# Patient Record
Sex: Female | Born: 1985 | Race: Asian | Hispanic: No | Marital: Married | Smoking: Never smoker
Health system: Southern US, Community
[De-identification: ages and names within clinical notes are randomized; demographics above are authoritative.]

## PROBLEM LIST (undated history)

## (undated) DIAGNOSIS — N200 Calculus of kidney: Secondary | ICD-10-CM

---

## 2019-09-22 ENCOUNTER — Encounter: Payer: Self-pay | Admitting: Adult Health

## 2019-10-05 ENCOUNTER — Telehealth: Payer: Self-pay | Admitting: Adult Health

## 2019-10-05 NOTE — Telephone Encounter (Signed)

## 2019-10-06 ENCOUNTER — Other Ambulatory Visit (HOSPITAL_COMMUNITY)
Admission: RE | Admit: 2019-10-06 | Discharge: 2019-10-06 | Disposition: A | Discharge status: Home / Self Care | Payer: 59 | Source: Ambulatory Visit | Attending: Adult Health | Admitting: Adult Health

## 2019-10-06 ENCOUNTER — Other Ambulatory Visit: Payer: Self-pay

## 2019-10-06 ENCOUNTER — Ambulatory Visit (INDEPENDENT_AMBULATORY_CARE_PROVIDER_SITE_OTHER): Payer: 59 | Admitting: Adult Health

## 2019-10-06 ENCOUNTER — Encounter: Payer: Self-pay | Admitting: Adult Health

## 2019-10-06 VITALS — BP 123/75 | HR 112 | Ht 63.25 in | Wt 158.5 lb

## 2019-10-06 DIAGNOSIS — Z01419 Encounter for gynecological examination (general) (routine) without abnormal findings: Secondary | ICD-10-CM

## 2019-10-06 DIAGNOSIS — N941 Unspecified dyspareunia: Secondary | ICD-10-CM | POA: Diagnosis not present

## 2019-10-06 NOTE — Progress Notes (Signed)
Patient ID: Aimee Howard, female   DOB: 1985/09/27, 34 y.o.   MRN: [id number] History of Present Illness: Aimee Howard is a 34 year old [place] African female, married, G0P0 in for a pelvic and pap.Her husband is with her to help with translation.  PCP is Dr Felecia Shelling.    Current Medications, Allergies, Past Medical History, Past Surgical History, Family History and Social History were reviewed in [employer] record.     Review of Systems:  Patient denies any headaches, hearing loss, fatigue, blurred vision, shortness of breath, chest pain, abdominal pain, problems with bowel movements, urination. No joint pain or mood swings. Has pain with sex.  Physical Exam:BP 123/75 (BP Location: Left Arm, Patient Position: Sitting, Cuff Size: Normal)   Pulse (!) 112   Ht 5' 3.25" (1.607 m)   Wt 158 lb 8 oz (71.9 kg)   LMP 09/15/2019   BMI 27.86 kg/m  General:  Well developed, well nourished, no acute distress Skin:  Warm and dry Neck:  Midline trachea, normal thyroid, good ROM, no lymphadenopathy Lungs; Clear to auscultation bilaterally Cardiovascular: Regular rate and rhythm  Pelvic:  External genitalia is normal in appearance, no lesions.  The vagina is normal in appearance. Urethra has no lesions or masses. The cervix is nulliparous, pap with GC/CHL and high risk HPV 16/18 genotyping performed.Marland Kitchen  Uterus is felt to be normal size, shape, and contour.  No adnexal masses or tenderness noted.Bladder is non tender, no masses felt Extremities/musculoskeletal:  No swelling or varicosities noted, no clubbing or cyanosis Psych:  No mood changes, alert and cooperative,seems happy Fall risk is low PHQ 2 score is 0. Examination chaperoned by Richelle Ito NP student.   Impression and Plan: 1. Encounter for gynecological examination with Papanicolaou smear of cervix Pap sent Physical and labs with PCP Mammogram at 41   2. Dyspareunia in female Increase foreplay Use good lubricate like  astro glide Try different positions  Try to relax and to touch self in vaginal area

## 2019-10-08 LAB — CYTOLOGY - PAP
Adequacy: ABSENT
Chlamydia: NEGATIVE
Comment: NEGATIVE
Comment: NEGATIVE
Comment: NORMAL
Diagnosis: NEGATIVE
High risk HPV: NEGATIVE
Neisseria Gonorrhea: NEGATIVE

## 2019-11-26 ENCOUNTER — Ambulatory Visit: Payer: 59 | Attending: Internal Medicine

## 2019-11-26 DIAGNOSIS — Z23 Encounter for immunization: Secondary | ICD-10-CM

## 2019-11-26 NOTE — Progress Notes (Signed)
   Covid-19 Vaccination Clinic  Name:  Aimee Howard    MRN: [id number] DOB: 1985/11/08  11/26/2019  Aimee Howard was observed post Covid-19 immunization for 15 minutes without incident. She was provided with Vaccine Information Sheet and instruction to access the V-Safe system.   Aimee Howard was instructed to call 911 with any severe reactions post vaccine: Marland Kitchen Difficulty breathing  . Swelling of face and throat  . A fast heartbeat  . A bad rash all over body  . Dizziness and weakness   Immunizations Administered    Name Date Dose VIS Date Route   Pfizer COVID-19 Vaccine 11/26/2019  9:30 AM 0.3 mL 07/24/2019 Intramuscular   Manufacturer: [employer], [employer]   Lot: [id number]   NDC: [address]-9

## 2019-12-22 ENCOUNTER — Ambulatory Visit: Payer: 59 | Attending: Internal Medicine

## 2019-12-22 DIAGNOSIS — Z23 Encounter for immunization: Secondary | ICD-10-CM

## 2019-12-22 NOTE — Progress Notes (Signed)
   Covid-19 Vaccination Clinic  Name:  Aimee Howard    MRN: [id number] DOB: Dec 13, 1985  12/22/2019  Ms. Aimee Howard was observed post Covid-19 immunization for 15 minutes without incident. She was provided with Vaccine Information Sheet and instruction to access the V-Safe system.   Ms. Aimee Howard was instructed to call 911 with any severe reactions post vaccine: Marland Kitchen Difficulty breathing  . Swelling of face and throat  . A fast heartbeat  . A bad rash all over body  . Dizziness and weakness   Immunizations Administered    Name Date Dose VIS Date Route   Pfizer COVID-19 Vaccine 12/22/2019 12:35 PM 0.3 mL 10/07/2018 Intramuscular   Manufacturer: [employer], [employer]   Lot: [id number]   NDC: [address]-5

## 2020-03-27 ENCOUNTER — Ambulatory Visit
Admission: EM | Admit: 2020-03-27 | Discharge: 2020-03-27 | Disposition: A | Discharge status: Home / Self Care | Payer: 59 | Attending: Emergency Medicine | Admitting: Emergency Medicine

## 2020-03-27 ENCOUNTER — Other Ambulatory Visit: Payer: Self-pay

## 2020-03-27 DIAGNOSIS — Z1152 Encounter for screening for COVID-19: Secondary | ICD-10-CM

## 2020-03-27 NOTE — ED Triage Notes (Signed)
covid screen for travel

## 2020-03-28 LAB — NOVEL CORONAVIRUS, NAA: SARS-CoV-2, NAA: NOT DETECTED

## 2020-03-28 LAB — SARS-COV-2, NAA 2 DAY TAT

## 2020-06-19 ENCOUNTER — Ambulatory Visit
Admission: EM | Admit: 2020-06-19 | Discharge: 2020-06-19 | Disposition: A | Discharge status: Home / Self Care | Payer: 59 | Attending: Emergency Medicine | Admitting: Emergency Medicine

## 2020-06-19 ENCOUNTER — Other Ambulatory Visit: Payer: Self-pay

## 2020-06-19 ENCOUNTER — Encounter: Payer: Self-pay | Admitting: Emergency Medicine

## 2020-06-19 DIAGNOSIS — R1030 Lower abdominal pain, unspecified: Secondary | ICD-10-CM | POA: Insufficient documentation

## 2020-06-19 DIAGNOSIS — R300 Dysuria: Secondary | ICD-10-CM | POA: Insufficient documentation

## 2020-06-19 DIAGNOSIS — N390 Urinary tract infection, site not specified: Secondary | ICD-10-CM | POA: Diagnosis not present

## 2020-06-19 LAB — POCT URINE PREGNANCY: Preg Test, Ur: NEGATIVE

## 2020-06-19 LAB — POCT URINALYSIS DIP (MANUAL ENTRY)
Bilirubin, UA: NEGATIVE
Glucose, UA: NEGATIVE mg/dL
Ketones, POC UA: NEGATIVE mg/dL
Leukocytes, UA: NEGATIVE
Nitrite, UA: POSITIVE — AB
Protein Ur, POC: NEGATIVE mg/dL
Spec Grav, UA: >=1.03 — AB (ref 1.010–1.025)
Urobilinogen, UA: 0.2 E.U./dL
pH, UA: 5.5 (ref 5.0–8.0)

## 2020-06-19 MED ORDER — NITROFURANTOIN MONOHYD MACRO 100 MG PO CAPS: 100.0000 mg | ORAL_CAPSULE | Freq: Two times a day (BID) | ORAL | 0 refills | Status: DC

## 2020-06-19 MED ORDER — PHENAZOPYRIDINE HCL 100 MG PO TABS: 100.0000 mg | ORAL_TABLET | Freq: Three times a day (TID) | ORAL | 0 refills | Status: DC | PRN

## 2020-06-19 NOTE — ED Triage Notes (Signed)
LLQ pain that started 2 days ago.  Reports pain with urination.  Denies any constipation.

## 2020-06-19 NOTE — Discharge Instructions (Addendum)
Urine culture sent.  We will call you with the results.   Push fluids and get plenty of rest.   Take antibiotic as directed and to completion Take pyridium as prescribed and as needed for symptomatic relief Follow up with PCP if symptoms persists Return here or go to ER if you have any new or worsening symptoms such as fever, worsening abdominal pain, nausea/vomiting, flank pain, etc... 

## 2020-06-19 NOTE — ED Provider Notes (Signed)
[facility]   Chief Complaint  Patient presents with  . Abdominal Pain     SUBJECTIVE:  Aimee Gunner is a 34 y.o. female who presented to the urgent care for complaint of dysuria and lower abdominal pain that started 2 days ago.  Patient denies a precipitating event, recent sexual encounter, excessive caffeine intake.  Localizes the pain to the lower abdomen.  Pain is intermitten an aching character.  Has tried OTC medications without relief.  Symptoms are made worse with urination.  Admits to similar symptoms in the past.  Denies fever, chills, nausea, vomiting, abdominal pain, flank pain, abnormal vaginal discharge or bleeding, hematuria.    LMP: Patient's last menstrual period was 06/07/2020.  ROS: As in HPI.  All other pertinent ROS negative.     History reviewed. No pertinent past medical history. History reviewed. No pertinent surgical history. No Known Allergies No current facility-administered medications on file prior to encounter.   No current outpatient medications on file prior to encounter.   Social History   Socioeconomic History  . Marital status: Married    Spouse name: Not on file  . Number of children: Not on file  . Years of education: Not on file  . Highest education level: Not on file  Occupational History  . Not on file  Tobacco Use  . Smoking status: Never Smoker  . Smokeless tobacco: Never Used  Vaping Use  . Vaping Use: Never used  Substance and Sexual Activity  . Alcohol use: Never  . Drug use: Never  . Sexual activity: Yes    Birth control/protection: None  Other Topics Concern  . Not on file  Social History Narrative  . Not on file   Social Determinants of Health   Financial Resource Strain:   . Difficulty of Paying Living Expenses: Not on file  Food Insecurity:   . Worried About Programme researcher, broadcasting/film/video in the Last Year: Not on file  . Ran Out of Food in the Last Year: Not on file  Transportation Needs:   . Lack of  Transportation (Medical): Not on file  . Lack of Transportation (Non-Medical): Not on file  Physical Activity:   . Days of Exercise per Week: Not on file  . Minutes of Exercise per Session: Not on file  Stress:   . Feeling of Stress : Not on file  Social Connections:   . Frequency of Communication with Friends and Family: Not on file  . Frequency of Social Gatherings with Friends and Family: Not on file  . Attends Religious Services: Not on file  . Active Member of Clubs or Organizations: Not on file  . Attends Banker Meetings: Not on file  . Marital Status: Not on file  Intimate Partner Violence:   . Fear of Current or Ex-Partner: Not on file  . Emotionally Abused: Not on file  . Physically Abused: Not on file  . Sexually Abused: Not on file   Family History  Problem Relation Age of Onset  . Diabetes Mother     OBJECTIVE:  Vitals:   06/19/20 0934 06/19/20 0936  BP:  109/74  Pulse:  (!) 117  Resp:  17  Temp:  98 F (36.7 C)  TempSrc:  Oral  SpO2:  98%  Weight: 165 lb (74.8 kg)   Height: 5\' 3"  (1.6 m)    General appearance: AOx3 in no acute distress HEENT: NCAT.  Oropharynx clear.  Lungs: clear to auscultation bilaterally without adventitious breath  sounds Heart: regular rate and rhythm.  Radial pulses 2+ symmetrical bilaterally Abdomen: soft; non-distended; no tenderness; bowel sounds present; no guarding or rebound tenderness Back: no CVA tenderness Extremities: no edema; symmetrical with no gross deformities Skin: warm and dry Neurologic: Ambulates from chair to exam table without difficulty Psychological: alert and cooperative; normal mood and affect  Labs Reviewed  POCT URINALYSIS DIP (MANUAL ENTRY) - Abnormal; Notable for the following components:      Result Value   Clarity, UA cloudy (*)    Spec Grav, UA >=1.030 (*)    Blood, UA trace-lysed (*)    Nitrite, UA Positive (*)    All other components within normal limits  URINE CULTURE  POCT  URINE PREGNANCY    ASSESSMENT & PLAN:  1. Acute lower UTI   2. Lower abdominal pain   3. Dysuria     Meds ordered this encounter  Medications  . nitrofurantoin, macrocrystal-monohydrate, (MACROBID) 100 MG capsule    Sig: Take 1 capsule (100 mg total) by mouth 2 (two) times daily.    Dispense:  10 capsule    Refill:  0  . phenazopyridine (PYRIDIUM) 100 MG tablet    Sig: Take 1 tablet (100 mg total) by mouth 3 (three) times daily as needed for pain.    Dispense:  10 tablet    Refill:  0   Discharge instructions  Urine culture sent.  We will call you with the results.   Push fluids and get plenty of rest.   Take antibiotic as directed and to completion Take pyridium as prescribed and as needed for symptomatic relief Follow up with PCP if symptoms persists Return here or go to ER if you have any new or worsening symptoms such as fever, worsening abdominal pain, nausea/vomiting, flank pain, etc...  Outlined signs and symptoms indicating need for more acute intervention. Patient verbalized understanding. After Visit Summary given.     Durward Parcel, FNP 06/19/20 1007

## 2020-06-21 LAB — URINE CULTURE
Culture: >=100000 — AB
Special Requests: NORMAL

## 2020-09-14 ENCOUNTER — Ambulatory Visit (INDEPENDENT_AMBULATORY_CARE_PROVIDER_SITE_OTHER): Payer: 59 | Admitting: Adult Health

## 2020-09-14 ENCOUNTER — Other Ambulatory Visit: Payer: Self-pay

## 2020-09-14 ENCOUNTER — Encounter: Payer: Self-pay | Admitting: Adult Health

## 2020-09-14 VITALS — BP 123/77 | HR 98 | Ht 64.0 in | Wt 155.5 lb

## 2020-09-14 DIAGNOSIS — R319 Hematuria, unspecified: Secondary | ICD-10-CM

## 2020-09-14 DIAGNOSIS — N390 Urinary tract infection, site not specified: Secondary | ICD-10-CM

## 2020-09-14 DIAGNOSIS — Z87442 Personal history of urinary calculi: Secondary | ICD-10-CM | POA: Diagnosis not present

## 2020-09-14 DIAGNOSIS — R109 Unspecified abdominal pain: Secondary | ICD-10-CM | POA: Insufficient documentation

## 2020-09-14 LAB — POCT URINALYSIS DIPSTICK
Glucose, UA: NEGATIVE
Ketones, UA: NEGATIVE
Nitrite, UA: POSITIVE
Protein, UA: POSITIVE — AB

## 2020-09-14 MED ORDER — SULFAMETHOXAZOLE-TRIMETHOPRIM 800-160 MG PO TABS: 1.0000 | ORAL_TABLET | Freq: Two times a day (BID) | ORAL | 0 refills | Status: DC

## 2020-09-14 MED ORDER — PHENAZOPYRIDINE HCL 200 MG PO TABS: 200.0000 mg | ORAL_TABLET | Freq: Three times a day (TID) | ORAL | 0 refills | Status: DC | PRN

## 2020-09-14 NOTE — Progress Notes (Signed)
  Subjective:     Patient ID: Aimee Howard, female   DOB: 06-Mar-1986, 35 y.o.   MRN: [id number]  HPI Aimee Howard is a 35 year old [place] African female, married, G0P0, in complaining of pain in left side and back and blood in urine. When asked about kidney stones she said she did years ago.  Husband is with her to help in translation.   Review of Systems Started Sunday with some blood in urine and then Monday pain in left side and back, is better today Reviewed past medical,surgical, social and family history. Reviewed medications and allergies.     Objective:   Physical Exam BP 123/77 (BP Location: Left Arm, Patient Position: Sitting, Cuff Size: Normal)   Pulse 98   Ht 5\' 4"  (1.626 m)   Wt 155 lb 8 oz (70.5 kg)   LMP 08/27/2020   BMI 26.69 kg/m urine dipstick 2+leuks, +nitrates,1+bood and trace protein, Skin warm and dry.. Lungs: clear to ausculation bilaterally. Cardiovascular: regular rate and rhythm.   No CVAT today. Fall risk is low  Upstream - 09/14/20 1513      Pregnancy Intention Screening   Does the patient want to become pregnant in the next year? Yes    Does the patient's partner want to become pregnant in the next year? Yes    Would the patient like to discuss contraceptive options today? No      Contraception Wrap Up   Current Method Pregnant/Seeking Pregnancy    End Method Pregnant/Seeking Pregnancy    Contraception Counseling Provided No          Assessment:     1. Hematuria, unspecified type - POCT Urinalysis Dipstick - Urine Culture - Urinalysis, Routine w reflex microscopic - 11/12/20 RENAL; Future  2. Left flank pain -scheduled renal US for 2/9/at 3:30 pm at [facility] to rule out kidney stone  - [facility]; Future  3. Urinary tract infection with hematuria, site unspecified Will rx septra ds and pyridium Meds ordered this encounter  Medications  . sulfamethoxazole-trimethoprim (BACTRIM DS) 800-160 MG tablet    Sig: Take 1 tablet by mouth 2 (two) times  daily. Take 1 bid    Dispense:  14 tablet    Refill:  0    Order Specific Question:   Supervising Provider    Answer:   [place], LUTHER H [[id number]]  . phenazopyridine (PYRIDIUM) 200 MG tablet    Sig: Take 1 tablet (200 mg total) by mouth 3 (three) times daily as needed for pain.    Dispense:  10 tablet    Refill:  0    Order Specific Question:   Supervising Provider    Answer:   Despina Hidden, LUTHER H [[id number]]    4. History of kidney stones - Despina Hidden RENAL; Future    Plan:     Will talk when results back Follow up prn

## 2020-09-15 LAB — MICROSCOPIC EXAMINATION
Casts: NONE SEEN /lpf
WBC, UA: >30 /hpf — AB (ref 0–5)

## 2020-09-15 LAB — URINALYSIS, ROUTINE W REFLEX MICROSCOPIC
Bilirubin, UA: NEGATIVE
Glucose, UA: NEGATIVE
Ketones, UA: NEGATIVE
Nitrite, UA: NEGATIVE
Specific Gravity, UA: 1.018 (ref 1.005–1.030)
Urobilinogen, Ur: 0.2 mg/dL (ref 0.2–1.0)
pH, UA: 6 (ref 5.0–7.5)

## 2020-09-17 LAB — URINE CULTURE

## 2020-09-19 ENCOUNTER — Other Ambulatory Visit: Payer: Self-pay | Admitting: Adult Health

## 2020-09-19 ENCOUNTER — Telehealth: Payer: Self-pay | Admitting: *Deleted

## 2020-09-19 MED ORDER — NITROFURANTOIN MONOHYD MACRO 100 MG PO CAPS: 100.0000 mg | ORAL_CAPSULE | Freq: Two times a day (BID) | ORAL | 0 refills | Status: DC

## 2020-09-19 NOTE — Telephone Encounter (Signed)
-----   Message from Adline Potter, NP sent at 09/19/2020 12:44 PM EST ----- Can let her know that I sent in Rx for macrobid, had + Ecoli on urine culture and it is resistan to the septra ds I prescribed earlier.

## 2020-09-19 NOTE — Telephone Encounter (Signed)
Spoke with pt's husband, giving him urine results. He is her interpreter. He voiced understanding. Pt will stop the Bactrim and will start Macrobid. JSY

## 2020-09-19 NOTE — Progress Notes (Signed)
will rx Macrobid for + [employer]

## 2020-09-21 ENCOUNTER — Other Ambulatory Visit: Payer: Self-pay

## 2020-09-21 ENCOUNTER — Ambulatory Visit (HOSPITAL_COMMUNITY)
Admission: RE | Admit: 2020-09-21 | Discharge: 2020-09-21 | Disposition: A | Discharge status: Home / Self Care | Payer: 59 | Source: Ambulatory Visit | Attending: Adult Health | Admitting: Adult Health

## 2020-09-21 DIAGNOSIS — Z87442 Personal history of urinary calculi: Secondary | ICD-10-CM | POA: Insufficient documentation

## 2020-09-21 DIAGNOSIS — R319 Hematuria, unspecified: Secondary | ICD-10-CM | POA: Insufficient documentation

## 2020-09-21 DIAGNOSIS — R109 Unspecified abdominal pain: Secondary | ICD-10-CM | POA: Diagnosis present

## 2020-09-26 ENCOUNTER — Encounter: Payer: Self-pay | Admitting: Adult Health

## 2020-09-26 ENCOUNTER — Telehealth: Payer: Self-pay | Admitting: Adult Health

## 2020-09-26 ENCOUNTER — Other Ambulatory Visit: Payer: Self-pay | Admitting: Adult Health

## 2020-09-26 DIAGNOSIS — N2889 Other specified disorders of kidney and ureter: Secondary | ICD-10-CM

## 2020-09-26 DIAGNOSIS — N200 Calculus of kidney: Secondary | ICD-10-CM

## 2020-09-26 HISTORY — DX: Other specified disorders of kidney and ureter: N28.89

## 2020-09-26 NOTE — Telephone Encounter (Signed)
Husband says she feels better and is aware needs CT for renal mass, and had small stone  will refer to urology, to be evaluated.

## 2020-10-12 ENCOUNTER — Other Ambulatory Visit: Payer: Self-pay

## 2020-10-12 ENCOUNTER — Ambulatory Visit (INDEPENDENT_AMBULATORY_CARE_PROVIDER_SITE_OTHER): Payer: 59 | Admitting: Urology

## 2020-10-12 ENCOUNTER — Encounter: Payer: Self-pay | Admitting: Urology

## 2020-10-12 VITALS — BP 88/58 | HR 92 | Temp 99.6°F | Ht 64.0 in | Wt 155.0 lb

## 2020-10-12 DIAGNOSIS — N2889 Other specified disorders of kidney and ureter: Secondary | ICD-10-CM | POA: Diagnosis not present

## 2020-10-12 LAB — MICROSCOPIC EXAMINATION
RBC, Urine: NONE SEEN /hpf (ref 0–2)
Renal Epithel, UA: NONE SEEN /hpf

## 2020-10-12 LAB — URINALYSIS, ROUTINE W REFLEX MICROSCOPIC
Bilirubin, UA: NEGATIVE
Glucose, UA: NEGATIVE
Ketones, UA: NEGATIVE
Nitrite, UA: NEGATIVE
Protein,UA: NEGATIVE
RBC, UA: NEGATIVE
Specific Gravity, UA: 1.01 (ref 1.005–1.030)
Urobilinogen, Ur: 0.2 mg/dL (ref 0.2–1.0)
pH, UA: 7 (ref 5.0–7.5)

## 2020-10-12 NOTE — Progress Notes (Signed)
Urological Symptom Review  Patient is experiencing the following symptoms: none   Review of Systems  Gastrointestinal (upper)  : Negative for upper GI symptoms  Gastrointestinal (lower) : Negative for lower GI symptoms  Constitutional : Negative for symptoms  Skin: Negative for skin symptoms  Eyes: Negative for eye symptoms  Ear/Nose/Throat : Negative for Ear/Nose/Throat symptoms  Hematologic/Lymphatic: Negative for Hematologic/Lymphatic symptoms  Cardiovascular : Negative for cardiovascular symptoms  Respiratory : Negative for respiratory symptoms  Endocrine: Negative for endocrine symptoms  Musculoskeletal: Negative for musculoskeletal symptoms  Neurological: negative  Psychologic: Negative for psychiatric symptoms

## 2020-10-12 NOTE — Progress Notes (Signed)
10/12/2020 2:43 PM   Aimee Howard 1986/04/24 [id number]  Referring provider: Adline Potter, NP [address] [place],  [place] [address]  Right renal mass  HPI: Aimee Howard is a 35yo here for evaluation of a right renal mass. She underwent renal US in Feb 2022 for evaluation of left flank pain and was found to have a 1.8cm cystic right mid pole renal mass. She denies any right flank pain. She denies hematuria. No family history of renal masses. NO other complaints today   PMH: Past Medical History:  Diagnosis Date  . Renal mass, right 09/26/2020   Will refer to urology, needs Ct to evaluate     Surgical History: History reviewed. No pertinent surgical history.  Home Medications:  Allergies as of 10/12/2020   No Known Allergies     Medication List       Accurate as of October 12, 2020  2:43 PM. If you have any questions, ask your nurse or doctor.        STOP taking these medications   nitrofurantoin (macrocrystal-monohydrate) 100 MG capsule Commonly known as: MACROBID Stopped by: Wilkie Aye, MD   sulfamethoxazole-trimethoprim 800-160 MG tablet Commonly known as: BACTRIM DS Stopped by: Wilkie Aye, MD     TAKE these medications   phenazopyridine 200 MG tablet Commonly known as: Pyridium Take 1 tablet (200 mg total) by mouth 3 (three) times daily as needed for pain.   VITAMIN C PO Take by mouth daily. Plus Zinc       Allergies: No Known Allergies  Family History: Family History  Problem Relation Age of Onset  . Diabetes Mother     Social History:  reports that she has never smoked. She has never used smokeless tobacco. She reports that she does not drink alcohol and does not use drugs.  ROS: All other review of systems were reviewed and are negative except what is noted above in HPI  Physical Exam: BP (!) 88/58   Pulse 92   Temp 99.6 F (37.6 C)   Ht 5\' 4"  (1.626 m)   Wt 155 lb (70.3 kg)   BMI 26.61 kg/m   Constitutional:   Alert and oriented, No acute distress. HEENT: [place] AT, moist mucus membranes.  Trachea midline, no masses. Cardiovascular: No clubbing, cyanosis, or edema. Respiratory: Normal respiratory effort, no increased work of breathing. GI: Abdomen is soft, nontender, nondistended, no abdominal masses GU: No CVA tenderness.  Lymph: No cervical or inguinal lymphadenopathy. Skin: No rashes, bruises or suspicious lesions. Neurologic: Grossly intact, no focal deficits, moving all 4 extremities. Psychiatric: Normal mood and affect.  Laboratory Data: No results found for: WBC, HGB, HCT, MCV, PLT  No results found for: CREATININE  No results found for: PSA  No results found for: TESTOSTERONE  No results found for: HGBA1C  Urinalysis    Component Value Date/Time   APPEARANCEUR Clear 09/14/2020 1655   GLUCOSEU Negative 09/14/2020 1655   BILIRUBINUR Negative 09/14/2020 1655   KETONESUR negative 06/19/2020 0952   PROTEINUR Trace 09/14/2020 1655   PROTEINUR Positive (A) 09/14/2020 1517   UROBILINOGEN 0.2 06/19/2020 0952   NITRITE Negative 09/14/2020 1655   NITRITE positive 09/14/2020 1517   LEUKOCYTESUR 2+ (A) 09/14/2020 1655   LEUKOCYTESUR Moderate (2+) (A) 09/14/2020 1517    Lab Results  Component Value Date   LABMICR See below: 09/14/2020   WBCUA >30 (A) 09/14/2020   LABEPIT 0-10 09/14/2020   BACTERIA Many (A) 09/14/2020    Pertinent Imaging:  Renal 09/22/2020: Images reviewed and discussed with the patient No results found for this or any previous visit.  No results found for this or any previous visit.  No results found for this or any previous visit.  No results found for this or any previous visit.  Results for orders placed during the hospital encounter of 09/21/20  US RENAL  Narrative CLINICAL DATA:  Initial evaluation for hematuria with left flank pain for 4 months.  EXAM: RENAL / URINARY TRACT ULTRASOUND COMPLETE  COMPARISON:  None.  FINDINGS: Right  Kidney:  Renal measurements: 10.5 x 4.2 x 5.9 cm = volume: 135.7 mL. Renal echogenicity within normal limits. 1.7 x 1.6 x 1.8 cm minimally complex cyst seen at the interpolar region. Few scattered low-level internal echoes without vascularity or solid component. Adjacent 5 mm calcific density could reflect a mural calcification versus nonobstructive calculus (image 12). No hydronephrosis.  Left Kidney:  Renal measurements: 11.2 x 4.2 x 4.8 cm = volume: 118.1 mL. Renal echogenicity within normal limits. No nephrolithiasis or hydronephrosis. No focal renal mass.  Bladder:  Appears normal for degree of bladder distention. Bilateral jets are visualized.  Other:  None.  IMPRESSION: 1. 1.8 cm mildly complex cyst at the interpolar right kidney. While this is likely benign, further evaluation with dedicated cross-sectional imaging suggested for complete evaluation. Renal mass protocol CT (as opposed to MRI) would be preferable given the adjacent calcification as below. 2. 5 mm calcific density within the interpolar right kidney, which could reflect a mural calcification associated with the adjacent cyst versus an adjacent nonobstructive calculus. This could also be assessed at follow-up. 3. Otherwise unremarkable and normal renal ultrasound. No hydronephrosis.   Electronically Signed By: Rise Mu M.D. On: 09/22/2020 15:16  No results found for this or any previous visit.  No results found for this or any previous visit.  No results found for this or any previous visit.   Assessment & Plan:    1. Kidney mass -We will obtain MRI abd for evaluation of her right cystic renal mass - Urinalysis, Routine w reflex microscopic    No follow-ups on file.  Wilkie Aye, MD  [facility] [place]

## 2020-10-18 ENCOUNTER — Encounter: Payer: Self-pay | Admitting: Urology

## 2020-10-18 NOTE — Patient Instructions (Signed)

## 2020-10-31 ENCOUNTER — Other Ambulatory Visit: Payer: Self-pay

## 2020-10-31 ENCOUNTER — Ambulatory Visit (HOSPITAL_COMMUNITY)
Admission: RE | Admit: 2020-10-31 | Discharge: 2020-10-31 | Disposition: A | Discharge status: Home / Self Care | Payer: 59 | Source: Ambulatory Visit | Attending: Urology | Admitting: Urology

## 2020-10-31 DIAGNOSIS — N2889 Other specified disorders of kidney and ureter: Secondary | ICD-10-CM | POA: Diagnosis present

## 2020-10-31 MED ORDER — GADOBUTROL 1 MMOL/ML IV SOLN
7.0000 mL | Freq: Once | INTRAVENOUS | Status: AC | PRN
  Administered 2020-10-31: 7 mL via INTRAVENOUS

## 2020-11-11 ENCOUNTER — Other Ambulatory Visit: Payer: Self-pay

## 2020-11-11 ENCOUNTER — Encounter: Payer: Self-pay | Admitting: Urology

## 2020-11-11 ENCOUNTER — Ambulatory Visit (INDEPENDENT_AMBULATORY_CARE_PROVIDER_SITE_OTHER): Payer: 59 | Admitting: Urology

## 2020-11-11 VITALS — BP 107/72 | HR 74 | Temp 99.9°F | Resp 12 | Ht 64.0 in | Wt 155.0 lb

## 2020-11-11 DIAGNOSIS — N2889 Other specified disorders of kidney and ureter: Secondary | ICD-10-CM

## 2020-11-11 DIAGNOSIS — N200 Calculus of kidney: Secondary | ICD-10-CM

## 2020-11-11 NOTE — Progress Notes (Signed)
Urological Symptom Review  Patient is experiencing the following symptoms: Frequent urination None listed  Review of Systems  Gastrointestinal (upper)  : Negative for upper GI symptoms  Gastrointestinal (lower) : Negative for lower GI symptoms  Constitutional : Negative for symptoms  Skin: Negative for skin symptoms  Eyes: Negative for eye symptoms  Ear/Nose/Throat : Negative for Ear/Nose/Throat symptoms  Hematologic/Lymphatic: Negative for Hematologic/Lymphatic symptoms  Cardiovascular : Negative for cardiovascular symptoms  Respiratory : Negative for respiratory symptoms  Endocrine: Negative for endocrine symptoms  Musculoskeletal: Negative for musculoskeletal symptoms  Neurological: Negative for neurological symptoms  Psychologic: Negative for psychiatric symptoms

## 2020-11-11 NOTE — Progress Notes (Signed)
11/11/2020 2:25 PM   Kyla Balzarine [birth year] [id number]  Referring provider: Avon Gully, MD [address] [place],  [place] [address]  Chief Complaint  Patient presents with  . Follow-up    HPI: Ms Aimee Howard is a 35yo here for followup for a right renal mass and nephrolithiasis. No stone events since last visit. 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 NO other complaints today. NO flank pain   PMH: Past Medical History:  Diagnosis Date  . Renal mass, right 09/26/2020   Will refer to urology, needs Ct to evaluate     Surgical History: History reviewed. No pertinent surgical history.  Home Medications:  Allergies as of 11/11/2020   No Known Allergies     Medication List       Accurate as of November 11, 2020  2:25 PM. If you have any questions, ask your nurse or doctor.        STOP taking these medications   phenazopyridine 200 MG tablet Commonly known as: Pyridium Stopped by: Wilkie Aye, MD   VITAMIN C PO Stopped by: Wilkie Aye, MD       Allergies: No Known Allergies  Family History: Family History  Problem Relation Age of Onset  . Diabetes Mother     Social History:  reports that she has never smoked. She has never used smokeless tobacco. She reports that she does not drink alcohol and does not use drugs.  ROS: All other review of systems were reviewed and are negative except what is noted above in HPI  Physical Exam: BP 107/72   Pulse 74   Temp 99.9 F (37.7 C)   Resp 12   Ht 5\' 4"  (1.626 m)   Wt 155 lb (70.3 kg)   BMI 26.61 kg/m   Constitutional:  Alert and oriented, No acute distress. HEENT: [place] AT, moist mucus membranes.  Trachea midline, no masses. Cardiovascular: No clubbing, cyanosis, or edema. Respiratory: Normal respiratory effort, no increased work of breathing. GI: Abdomen is soft, nontender, nondistended, no abdominal masses GU: No CVA tenderness.  Lymph: No  cervical or inguinal lymphadenopathy. Skin: No rashes, bruises or suspicious lesions. Neurologic: Grossly intact, no focal deficits, moving all 4 extremities. Psychiatric: Normal mood and affect.  Laboratory Data: No results found for: WBC, HGB, HCT, MCV, PLT  No results found for: CREATININE  No results found for: PSA  No results found for: TESTOSTERONE  No results found for: HGBA1C  Urinalysis    Component Value Date/Time   APPEARANCEUR Clear 10/12/2020 1431   GLUCOSEU Negative 10/12/2020 1431   BILIRUBINUR Negative 10/12/2020 1431   KETONESUR negative 06/19/2020 0952   PROTEINUR Negative 10/12/2020 1431   UROBILINOGEN 0.2 06/19/2020 0952   NITRITE Negative 10/12/2020 1431   LEUKOCYTESUR 1+ (A) 10/12/2020 1431    Lab Results  Component Value Date   LABMICR See below: 10/12/2020   WBCUA 11-30 (A) 10/12/2020   LABEPIT 0-10 10/12/2020   BACTERIA Few 10/12/2020    Pertinent Imaging: MRI 3/22: Images reviewed and discussed with the patient No results found for this or any previous visit.  No results found for this or any previous visit.  No results found for this or any previous visit.  No results found for this or any previous visit.  Results for orders placed during the hospital encounter of 09/21/20  11/19/20 RENAL  Narrative CLINICAL DATA:  Initial evaluation for hematuria with left flank pain for  4 months.  EXAM: RENAL / URINARY TRACT ULTRASOUND COMPLETE  COMPARISON:  None.  FINDINGS: Right Kidney:  Renal measurements: 10.5 x 4.2 x 5.9 cm = volume: 135.7 mL. Renal echogenicity within normal limits. 1.7 x 1.6 x 1.8 cm minimally complex cyst seen at the interpolar region. Few scattered low-level internal echoes without vascularity or solid component. Adjacent 5 mm calcific density could reflect a mural calcification versus nonobstructive calculus (image 12). No hydronephrosis.  Left Kidney:  Renal measurements: 11.2 x 4.2 x 4.8 cm = volume: 118.1 mL.  Renal echogenicity within normal limits. No nephrolithiasis or hydronephrosis. No focal renal mass.  Bladder:  Appears normal for degree of bladder distention. Bilateral jets are visualized.  Other:  None.  IMPRESSION: 1. 1.8 cm mildly complex cyst at the interpolar right kidney. While this is likely benign, further evaluation with dedicated cross-sectional imaging suggested for complete evaluation. Renal mass protocol CT (as opposed to MRI) would be preferable given the adjacent calcification as below. 2. 5 mm calcific density within the interpolar right kidney, which could reflect a mural calcification associated with the adjacent cyst versus an adjacent nonobstructive calculus. This could also be assessed at follow-up. 3. Otherwise unremarkable and normal renal ultrasound. No hydronephrosis.   Electronically Signed By: Rise Mu M.D. On: 09/22/2020 15:16  No results found for this or any previous visit.  No results found for this or any previous visit.  No results found for this or any previous visit.   Assessment & Plan:    1. 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 She requires no further workup for the renal cyst.  - Urinalysis, Routine w reflex microscopic  2. Nephrolithiasis -RTC 6 months with KUB - Abdomen 1 view (KUB); Future   Return in about 6 months (around 05/13/2021) for KUB.  Wilkie Aye, MD  [facility] [place]

## 2020-11-11 NOTE — Patient Instructions (Signed)

## 2020-11-25 ENCOUNTER — Ambulatory Visit: Payer: Self-pay

## 2021-02-11 ENCOUNTER — Other Ambulatory Visit: Payer: Self-pay

## 2021-02-11 ENCOUNTER — Encounter (HOSPITAL_COMMUNITY): Payer: Self-pay

## 2021-02-11 ENCOUNTER — Emergency Department (HOSPITAL_COMMUNITY)
Admission: EM | Admit: 2021-02-11 | Discharge: 2021-02-11 | Disposition: A | Discharge status: Home / Self Care | Payer: 59 | Attending: Emergency Medicine | Admitting: Emergency Medicine

## 2021-02-11 DIAGNOSIS — X58XXXA Exposure to other specified factors, initial encounter: Secondary | ICD-10-CM | POA: Diagnosis not present

## 2021-02-11 DIAGNOSIS — T162XXA Foreign body in left ear, initial encounter: Secondary | ICD-10-CM | POA: Insufficient documentation

## 2021-02-11 MED ORDER — CEPHALEXIN 500 MG PO CAPS: 500.0000 mg | ORAL_CAPSULE | Freq: Four times a day (QID) | ORAL | 0 refills | Status: AC

## 2021-02-11 MED ORDER — LIDOCAINE HCL (PF) 1 % IJ SOLN
5.0000 mL | Freq: Once | INTRAMUSCULAR | Status: AC
  Administered 2021-02-11: 23:00:00 5 mL
  Filled 2021-02-11: qty 30

## 2021-02-11 NOTE — ED Triage Notes (Signed)
Pt arrived via POV with significant other c/o clear plastic end piece being lodged inside left ear lobe. Redness and swelling present during Triage.

## 2021-02-11 NOTE — ED Provider Notes (Signed)
[facility] EMERGENCY DEPARTMENT Provider Note   CSN: [id number] Arrival date & time: 02/11/21  2155     History Chief Complaint  Patient presents with   Foreign Body in Ear    Left ear lobe    Aimee Howard is a 35 y.o. female.  Patient presents chief complaint of backing of earring stuck in the left ear.  She thinks it may have been stuck there for about 2 days.  She is unable to get it out at home and presents to the ER complaining of pain there.  Denies fevers or cough or vomiting or diarrhea.      Past Medical History:  Diagnosis Date   Renal mass, right 09/26/2020   Will refer to urology, needs Ct to evaluate     Patient Active Problem List   Diagnosis Date Noted   Renal mass, right 09/26/2020   History of kidney stones 09/14/2020   Urinary tract infection with hematuria 09/14/2020   Left flank pain 09/14/2020   Hematuria 09/14/2020   Dyspareunia in female 10/06/2019   Encounter for gynecological examination with Papanicolaou smear of cervix 10/06/2019    History reviewed. No pertinent surgical history.   OB History     Gravida  0   Para  0   Term  0   Preterm  0   AB  0   Living  0      SAB  0   IAB  0   Ectopic  0   Multiple  0   Live Births  0           Family History  Problem Relation Age of Onset   Diabetes Mother     Social History   Tobacco Use   Smoking status: Never   Smokeless tobacco: Never  Vaping Use   Vaping Use: Never used  Substance Use Topics   Alcohol use: Never   Drug use: Never    Home Medications Prior to Admission medications   Medication Sig Start Date End Date Taking? Authorizing Provider  cephALEXin (KEFLEX) 500 MG capsule Take 1 capsule (500 mg total) by mouth 4 (four) times daily for 5 days. 02/11/21 02/16/21 Yes Cheryll Cockayne, MD    Allergies    Patient has no known allergies.  Review of Systems   Review of Systems  Constitutional:  Negative for fever.  HENT:  Positive for ear pain.    Eyes:  Negative for pain.  Respiratory:  Negative for cough.   Cardiovascular:  Negative for chest pain.  Gastrointestinal:  Negative for abdominal pain.  Genitourinary:  Negative for flank pain.  Musculoskeletal:  Negative for back pain.  Skin:  Negative for rash.  Neurological:  Negative for headaches.   Physical Exam Updated Vital Signs BP 110/83 (BP Location: Right Arm)   Pulse 93   Temp 98.5 F (36.9 C) (Oral)   Resp 16   Ht 5\' 4"  (1.626 m)   Wt 70 kg   SpO2 100%   BMI 26.49 kg/m   Physical Exam Constitutional:      General: She is not in acute distress.    Appearance: Normal appearance.  HENT:     Head: Normocephalic.     Ears:     Comments: Left ear lobe inferior portion has some swelling and induration with palpable foreign body.  Mild erythema present.  Scant discharge noted.  No mastoid tenderness.    Nose: Nose normal.  Eyes:     Extraocular  Movements: Extraocular movements intact.  Cardiovascular:     Rate and Rhythm: Normal rate.  Pulmonary:     Effort: Pulmonary effort is normal.  Musculoskeletal:        General: Normal range of motion.     Cervical back: Normal range of motion.  Neurological:     General: No focal deficit present.     Mental Status: She is alert. Mental status is at baseline.    ED Results / Procedures / Treatments   Labs (all labs ordered are listed, but only abnormal results are displayed) Labs Reviewed - No data to display  EKG None  Radiology No results found.  Procedures .Foreign Body Removal  Date/Time: 02/11/2021 10:59 PM Performed by: Cheryll Cockayne, MD Authorized by: Cheryll Cockayne, MD  Comments: Lidocaine 1% total of 1 cc injected into the left ear inferior aspect.  Pickups used to squeeze out the foreign body patient tolerated procedure well.  Scant amount of bleeding and scant amount of purulent discharge noted.    Medications Ordered in ED Medications  lidocaine (PF) (XYLOCAINE) 1 % injection 5 mL (has no  administration in time range)    ED Course  I have reviewed the triage vital signs and the nursing notes.  Pertinent labs & imaging results that were available during my care of the patient were reviewed by me and considered in my medical decision making (see chart for details).    MDM Rules/Calculators/A&P                          Lidocaine 1% injected into foreign body region.  Pickups used to extract the foreign body intact.  Dressing placed on the ear.  Patient tolerated procedure well.  Advised Neosporin dressing and no earrings until fully healed approximately 3 weeks.  Patient is with understanding discharged home in stable condition.  Advised immediate return for redness purulent discharge worsening pain or any additional concerns.  Final Clinical Impression(s) / ED Diagnoses Final diagnoses:  Acute foreign body of left earlobe, initial encounter    Rx / DC Orders ED Discharge Orders          Ordered    cephALEXin (KEFLEX) 500 MG capsule  4 times daily        02/11/21 2300             Cheryll Cockayne, MD 02/11/21 2300

## 2021-02-11 NOTE — ED Notes (Signed)
Pt does not speak english. Spouse present to translate for Pt.

## 2021-02-11 NOTE — Discharge Instructions (Addendum)
Topical antibiotic ointment to the ear such as bacitracin or Neosporin over-the-counter.  Return if you have purulent discharge increased redness increased pain or any additional concerns.  Avoid earrings until the ear is fully healed approximately 3 to 4 weeks.

## 2021-05-16 ENCOUNTER — Ambulatory Visit: Payer: 59 | Admitting: Urology

## 2021-05-22 ENCOUNTER — Ambulatory Visit: Payer: 59 | Admitting: Urology

## 2021-09-26 ENCOUNTER — Ambulatory Visit (INDEPENDENT_AMBULATORY_CARE_PROVIDER_SITE_OTHER): Payer: 59 | Admitting: Adult Health

## 2021-09-26 ENCOUNTER — Other Ambulatory Visit: Payer: Self-pay

## 2021-09-26 ENCOUNTER — Encounter: Payer: Self-pay | Admitting: Adult Health

## 2021-09-26 VITALS — BP 117/78 | HR 103 | Ht 64.0 in | Wt 152.8 lb

## 2021-09-26 DIAGNOSIS — Z319 Encounter for procreative management, unspecified: Secondary | ICD-10-CM | POA: Diagnosis not present

## 2021-09-26 NOTE — Progress Notes (Signed)
°  Subjective:     Patient ID: Aimee Howard, female   DOB: 01/06/1986, 36 y.o.   MRN: [id number]  HPI Aimee Howard is a 36 year old [place] African female, married, G0P0, in with her husband to talk about getting pregnant. They have never had labs or semen analysis.  PCP is Dr Felecia Shelling.  Lab Results  Component Value Date   DIAGPAP  10/06/2019    - Negative for intraepithelial lesion or malignancy (NILM)   HPVHIGH Negative 10/06/2019    Review of Systems Periods regular Reviewed past medical,surgical, social and family history. Reviewed medications and allergies.     Objective:   Physical Exam BP 117/78 (BP Location: Right Arm, Patient Position: Sitting, Cuff Size: Normal)    Pulse (!) 103    Ht 5\' 4"  (1.626 m)    Wt 152 lb 12.8 oz (69.3 kg)    LMP 09/09/2021 (Exact Date)    BMI 26.23 kg/m     Skin warm and dry. Lungs: clear to ausculation bilaterally. Cardiovascular: regular rate and rhythm.   Upstream - 09/26/21 0905       Pregnancy Intention Screening   Does the patient want to become pregnant in the next year? Yes    Does the patient's partner want to become pregnant in the next year? Yes    Would the patient like to discuss contraceptive options today? No      Contraception Wrap Up   Current Method Pregnant/Seeking Pregnancy    End Method Pregnant/Seeking Pregnancy    Contraception Counseling Provided No             Assessment:     1. Patient desires pregnancy Take PNV with folic acid 800 mcg Will check progesterone level 09/29/21 to see if ovulating if not will try clomid Discussed timing of sex   May get SA if not pregnant in 6 months They declines referral to Fertility Specialist just yet   Plan:     We will talk when labs back Follow up appt TBD then

## 2021-09-30 LAB — PROGESTERONE: Progesterone: 15.6 ng/mL

## 2021-10-04 ENCOUNTER — Other Ambulatory Visit: Payer: Self-pay | Admitting: Adult Health

## 2021-10-04 MED ORDER — CLOMIPHENE CITRATE 50 MG PO TABS: ORAL_TABLET | ORAL | 2 refills | Status: DC

## 2021-10-04 NOTE — Progress Notes (Signed)
Will rx clomid 

## 2021-10-05 ENCOUNTER — Other Ambulatory Visit: Payer: Self-pay | Admitting: Adult Health

## 2021-10-05 DIAGNOSIS — Z319 Encounter for procreative management, unspecified: Secondary | ICD-10-CM

## 2021-10-05 NOTE — Progress Notes (Signed)
LMP 10/05/21, start clomid Saturday and check progesterone level 10/25/21

## 2021-10-26 LAB — PROGESTERONE: Progesterone: 15.5 ng/mL

## 2021-10-30 ENCOUNTER — Other Ambulatory Visit: Payer: Self-pay | Admitting: Adult Health

## 2021-10-30 DIAGNOSIS — Z319 Encounter for procreative management, unspecified: Secondary | ICD-10-CM

## 2021-10-30 NOTE — Progress Notes (Signed)
Ck progesterone level 11/20/21 ?

## 2021-11-01 ENCOUNTER — Other Ambulatory Visit: Payer: Self-pay | Admitting: Adult Health

## 2021-11-21 LAB — PROGESTERONE: Progesterone: 25.8 ng/mL

## 2022-12-02 IMAGING — MR MR ABDOMEN WO/W CM
20 series · 48 of 48 positions shown · IV contrast (7 ml Gadavist)
Comparison: Renal ultrasound September 21, 2020

CLINICAL DATA: Further characterization of renal lesion seen on
prior ultrasound.

EXAM:
MRI ABDOMEN WITHOUT AND WITH CONTRAST
TECHNIQUE: Multiplanar multisequence MR imaging of the abdomen was performed
both before and after the administration of intravenous contrast.
CONTRAST:  7mL GADAVIST GADOBUTROL 1 MMOL/ML IV SOLN

[Series 4: cor haste · coronal · 6.0mm · 1.19mm/px · 2 of 30 slices shown]
[im 1/30]
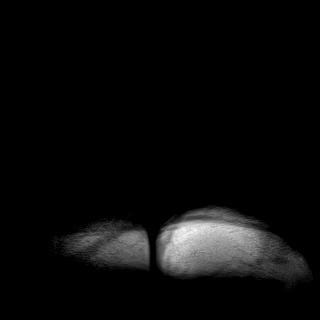
[im 30/30]
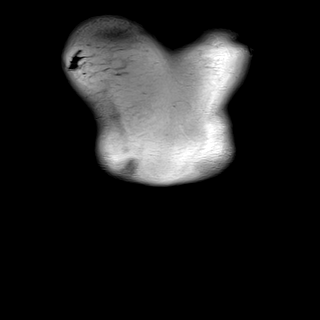

[Series 5: ax haste · axial · 6.0mm · 1.19mm/px · z∈[-46,+163]mm · 2 of 30 slices shown]
[im 1/30]
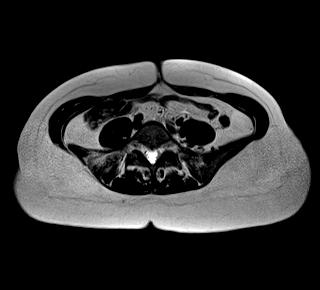
[im 30/30]
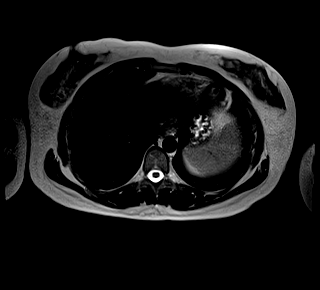

[Series 6: T2 fat-sat · axial · 6.0mm · 1.19mm/px · z∈[-46,+163]mm · 2 of 30 slices shown]
[im 1/30]
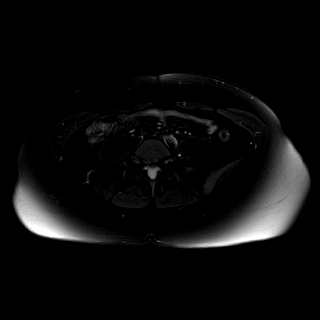
[im 30/30]
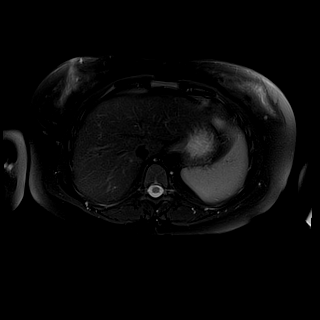

[Series 7: ax in and · axial · 3.5mm · 1.31mm/px · z∈[-66,+183]mm · 4 of 72 slices shown (1 of 2)]
[im 1/72]
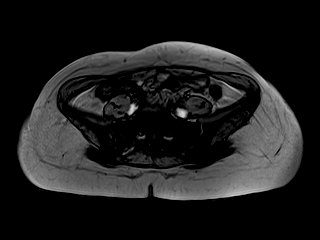
[im 24/72]
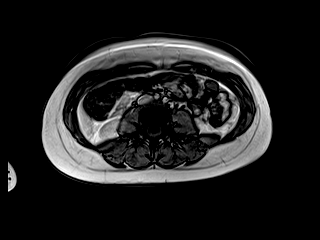
[im 48/72]
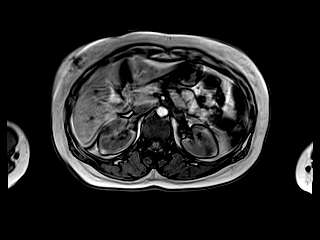
[im 72/72]
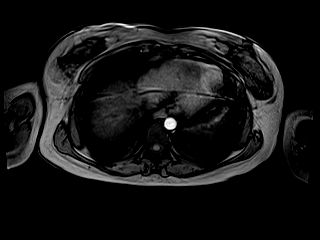

[Series 8: ax in and · axial · 3.5mm · 1.31mm/px · z∈[-66,+183]mm · 3 of 72 slices shown (2 of 2)]
[im 1/72]
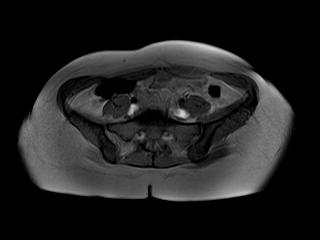
[im 36/72]
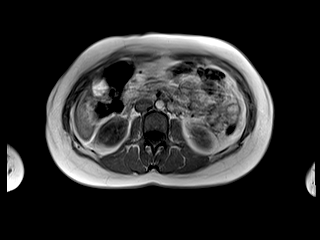
[im 72/72]
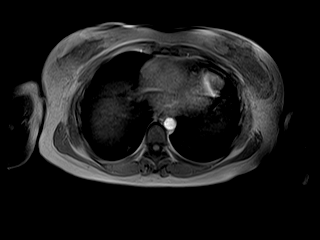

[Series 9: DWI · axial · 6.0mm · 1.42mm/px · 1 of 30 slices shown (1 of 4)]
[im 1/30]
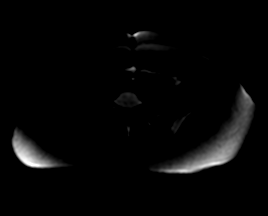

[Series 9: DWI · axial · 6.0mm · 1.42mm/px · 1 of 30 slices shown (2 of 4)]
[im 1/30]
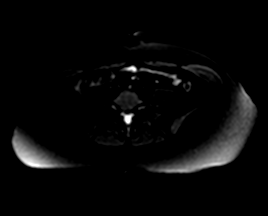

[Series 9: DWI · axial · 6.0mm · 1.42mm/px · 1 of 30 slices shown (3 of 4)]
[im 1/30]
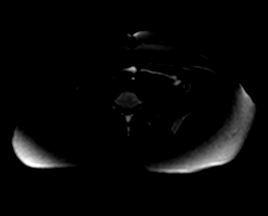

[Series 10: DWI · axial · 6.0mm · 1.42mm/px · 1 of 30 slices shown (4 of 4)]
[im 1/30]
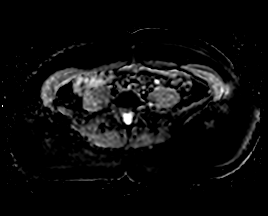

[Series 11: bSSFP · axial · 6.0mm · 0.74mm/px · 1 of 30 slices shown]
[im 1/30]
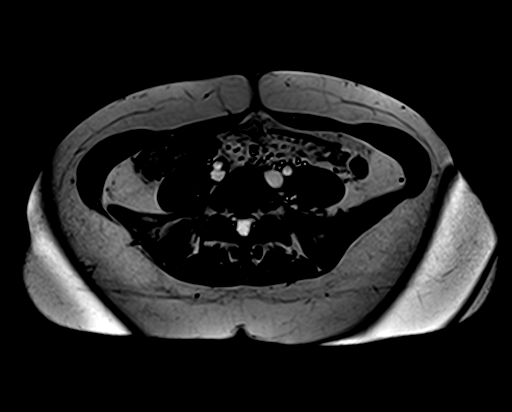

[Series 12: t1_vibe_fs_tra_p4_bh_pre · axial · 3.0mm · 1.34mm/px · z∈[-48,+165]mm · 3 of 72 slices shown]
[im 1/72]
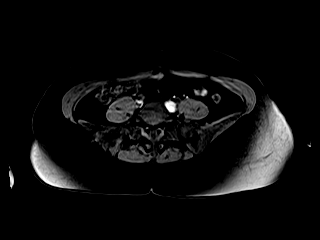
[im 36/72]
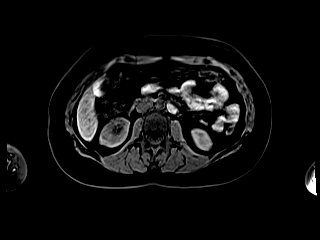
[im 72/72]
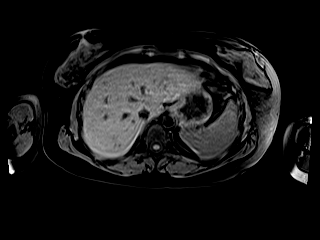

[Series 14: t1_vibe_fs_tra_p4_bh_post · axial · 3.0mm · 1.34mm/px · z∈[-48,+165]mm · 3 of 72 slices shown (1 of 4)]
[im 1/72]
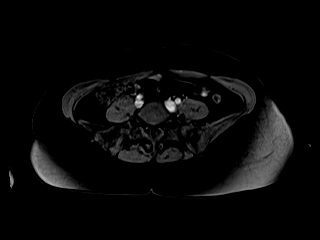
[im 36/72]
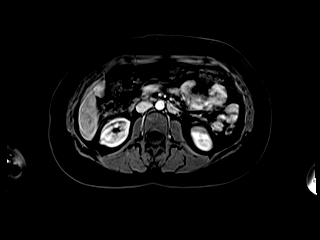
[im 72/72]
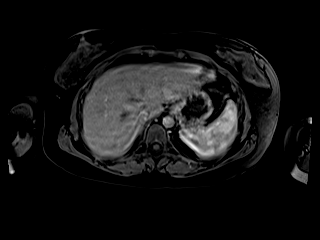

[Series 15: t1_vibe_fs_tra_p4_bh_post_sub · axial · 3.0mm · 1.34mm/px · z∈[-48,+165]mm · 3 of 72 slices shown (1 of 4)]
[im 1/72]
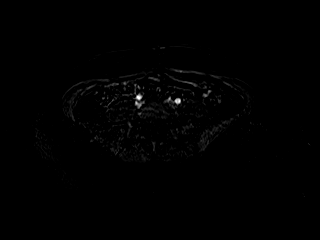
[im 36/72]
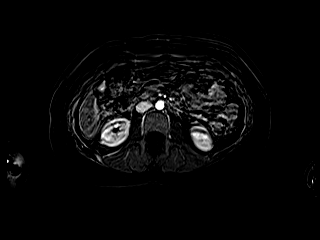
[im 72/72]
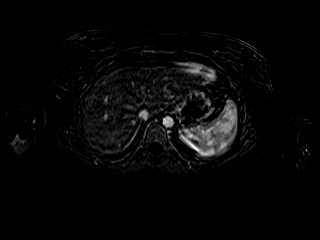

[Series 16: t1_vibe_fs_tra_p4_bh_post · axial · 3.0mm · 1.34mm/px · z∈[-48,+165]mm · 3 of 72 slices shown (2 of 4)]
[im 1/72]
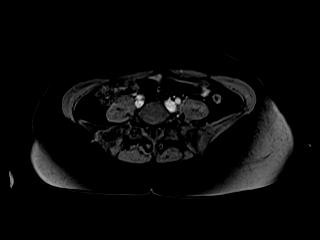
[im 36/72]
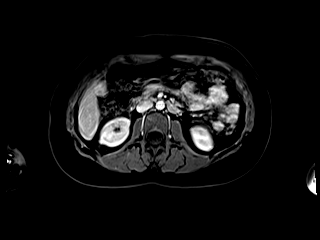
[im 72/72]
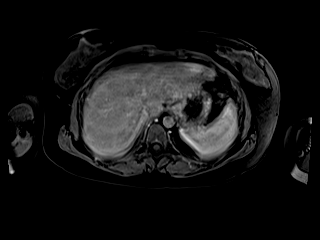

[Series 17: t1_vibe_fs_tra_p4_bh_post_sub · axial · 3.0mm · 1.34mm/px · z∈[-48,+165]mm · 3 of 72 slices shown (2 of 4)]
[im 1/72]
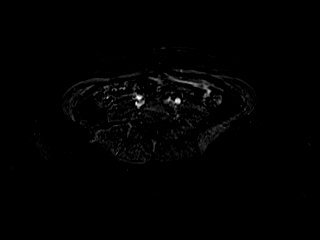
[im 36/72]
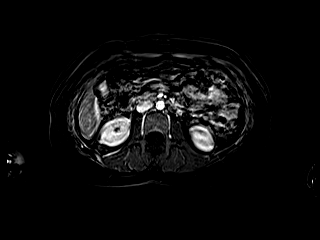
[im 72/72]
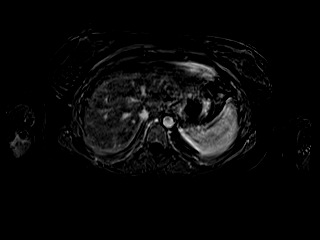

[Series 18: t1_vibe_fs_tra_p4_bh_post · axial · 3.0mm · 1.34mm/px · z∈[-48,+165]mm · 3 of 72 slices shown (3 of 4)]
[im 1/72]
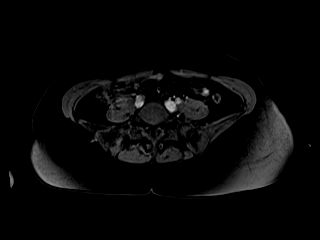
[im 36/72]
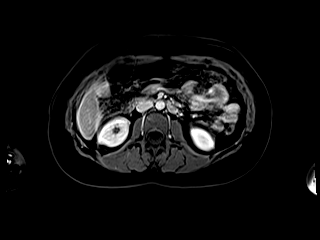
[im 72/72]
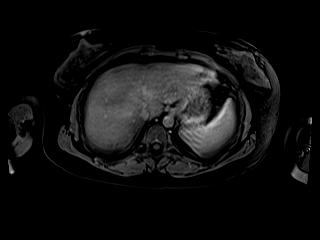

[Series 19: t1_vibe_fs_tra_p4_bh_post_sub · axial · 3.0mm · 1.34mm/px · z∈[-48,+165]mm · 3 of 72 slices shown (3 of 4)]
[im 1/72]
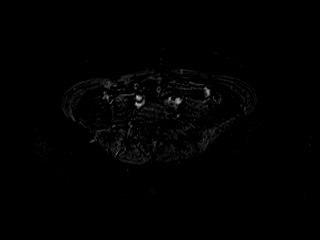
[im 36/72]
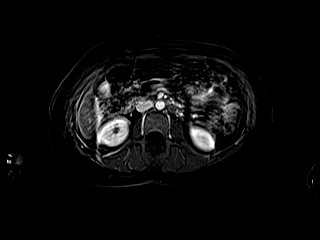
[im 72/72]
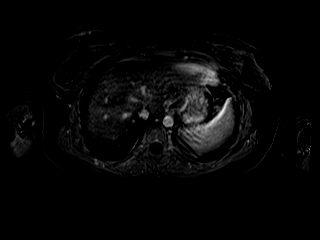

[Series 20: t1_vibe_fs_tra_p4_bh_post · axial · 3.0mm · 1.34mm/px · z∈[-48,+165]mm · 3 of 72 slices shown (4 of 4)]
[im 1/72]
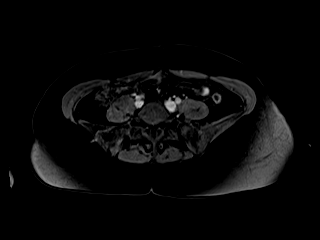
[im 36/72]
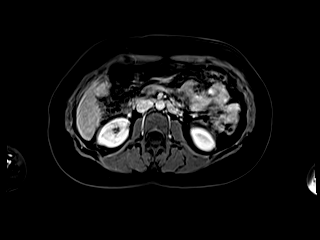
[im 72/72]
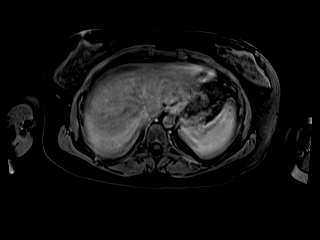

[Series 21: t1_vibe_fs_tra_p4_bh_post_sub · axial · 3.0mm · 1.34mm/px · z∈[-48,+165]mm · 3 of 72 slices shown (4 of 4)]
[im 1/72]
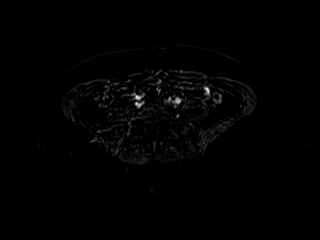
[im 36/72]
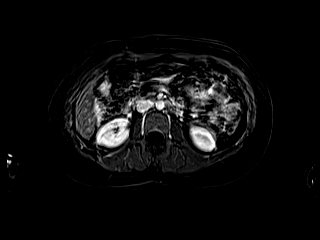
[im 72/72]
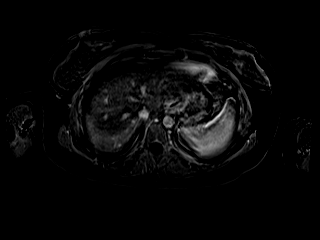

[Series 22: T1 dynamic post-contrast · coronal · 3.0mm · 1.31mm/px · 3 of 72 slices shown]
[im 1/72]
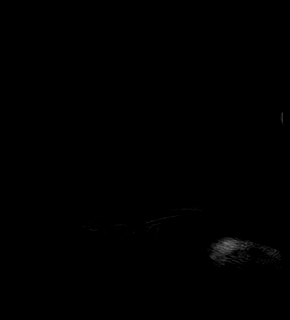
[im 36/72]
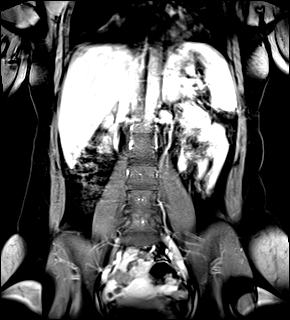
[im 72/72]
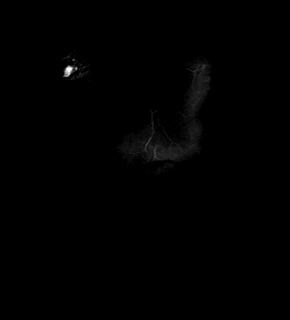

[48 of 48 positions shown; findings below may reference images not displayed]

FINDINGS: Lower chest: No acute abnormality.

Hepatobiliary: No hepatic steatosis. No suspicious hepatic lesions.
Gallbladder is unremarkable. No biliary ductal dilation

Pancreas: No mass, inflammatory changes, or other parenchymal
abnormality identified.

Spleen:  Within normal limits in size and appearance.

Adrenals/Urinary Tract:  Bilateral adrenal glands are unremarkable.

No hydronephrosis.  Symmetric enhancement of kidneys.

Within the interpolar region of the RIGHT kidney there is a 1.7 cm
T2 hyperintense well-circumscribed interpolar renal lesion on image
[DATE]. Evaluation of this lesion is somewhat limited by respiratory
motion on pre and postcontrast T1 imaging. However, the lesion does
demonstrate some heterogeneous intrinsic T1 shortening but is
without definite suspicious enhancement on subtraction image.
Additionally, there is no soft tissue nodularity,
thickened/irregular septation, or reduced diffusivity visualized
within the lesion. Imaging findings are most consistent with a
proteinaceous/hemorrhagic cyst, (Bosniak category 2 renal lesion).

Stomach/Bowel: Visualized portions within the abdomen are
unremarkable.

Vascular/Lymphatic: No pathologically enlarged lymph nodes
identified. No abdominal aortic aneurysm demonstrated.

Other:  No abdominal ascites

Musculoskeletal: No suspicious bone lesions identified.
IMPRESSION: RIGHT interpolar renal lesion, corresponding with the lesion seen on
prior ultrasound, is most consistent with a
proteinaceous/hemorrhagic renal cyst, (Bosniak category 2 renal
lesion). No suspicious enhancing renal lesions.

## 2023-08-24 ENCOUNTER — Other Ambulatory Visit: Payer: Self-pay

## 2023-08-24 ENCOUNTER — Emergency Department (HOSPITAL_COMMUNITY)
Admission: EM | Admit: 2023-08-24 | Discharge: 2023-08-25 | Disposition: A | Discharge status: Home / Self Care | Payer: No Typology Code available for payment source | Attending: Emergency Medicine | Admitting: Emergency Medicine

## 2023-08-24 ENCOUNTER — Encounter (HOSPITAL_COMMUNITY): Payer: Self-pay | Admitting: Emergency Medicine

## 2023-08-24 ENCOUNTER — Emergency Department (HOSPITAL_COMMUNITY): Payer: No Typology Code available for payment source

## 2023-08-24 DIAGNOSIS — N132 Hydronephrosis with renal and ureteral calculous obstruction: Secondary | ICD-10-CM | POA: Insufficient documentation

## 2023-08-24 DIAGNOSIS — N200 Calculus of kidney: Secondary | ICD-10-CM

## 2023-08-24 DIAGNOSIS — R109 Unspecified abdominal pain: Secondary | ICD-10-CM | POA: Diagnosis present

## 2023-08-24 HISTORY — DX: Calculus of kidney: N20.0

## 2023-08-24 NOTE — ED Triage Notes (Signed)
 Pt with c/o L sided flank pain and hematuria. Pt with hx of kidney stones.

## 2023-08-25 ENCOUNTER — Emergency Department (HOSPITAL_COMMUNITY): Payer: Medicaid Other

## 2023-08-25 ENCOUNTER — Ambulatory Visit: Payer: Self-pay

## 2023-08-25 LAB — URINALYSIS, ROUTINE W REFLEX MICROSCOPIC
Bilirubin Urine: NEGATIVE
Glucose, UA: NEGATIVE mg/dL
Ketones, ur: NEGATIVE mg/dL
Nitrite: NEGATIVE
Protein, ur: NEGATIVE mg/dL
Specific Gravity, Urine: 1.006 (ref 1.005–1.030)
pH: 7 (ref 5.0–8.0)

## 2023-08-25 LAB — BASIC METABOLIC PANEL
Anion gap: 9 (ref 5–15)
BUN: 12 mg/dL (ref 6–20)
CO2: 21 mmol/L — ABNORMAL LOW (ref 22–32)
Calcium: 9.3 mg/dL (ref 8.9–10.3)
Chloride: 104 mmol/L (ref 98–111)
Creatinine, Ser: 0.68 mg/dL (ref 0.44–1.00)
GFR, Estimated: >60 mL/min (ref >60)
Glucose, Bld: 115 mg/dL — ABNORMAL HIGH (ref 70–99)
Potassium: 3.2 mmol/L — ABNORMAL LOW (ref 3.5–5.1)
Sodium: 134 mmol/L — ABNORMAL LOW (ref 135–145)

## 2023-08-25 LAB — CBC WITH DIFFERENTIAL/PLATELET
Abs Immature Granulocytes: 0.04 10*3/uL (ref 0.00–0.07)
Basophils Absolute: 0.1 10*3/uL (ref 0.0–0.1)
Basophils Relative: 1 %
Eosinophils Absolute: 0.2 10*3/uL (ref 0.0–0.5)
Eosinophils Relative: 1 %
HCT: 35.2 % — ABNORMAL LOW (ref 36.0–46.0)
Hemoglobin: 11.2 g/dL — ABNORMAL LOW (ref 12.0–15.0)
Immature Granulocytes: 0 %
Lymphocytes Relative: 25 %
Lymphs Abs: 2.8 10*3/uL (ref 0.7–4.0)
MCH: 26.3 pg (ref 26.0–34.0)
MCHC: 31.8 g/dL (ref 30.0–36.0)
MCV: 82.6 fL (ref 80.0–100.0)
Monocytes Absolute: 0.9 10*3/uL (ref 0.1–1.0)
Monocytes Relative: 8 %
Neutro Abs: 7.1 10*3/uL (ref 1.7–7.7)
Neutrophils Relative %: 65 %
Platelets: 368 10*3/uL (ref 150–400)
RBC: 4.26 MIL/uL (ref 3.87–5.11)
RDW: 15.4 % (ref 11.5–15.5)
WBC: 11.1 10*3/uL — ABNORMAL HIGH (ref 4.0–10.5)
nRBC: 0 % (ref 0.0–0.2)

## 2023-08-25 LAB — POC URINE PREG, ED: Preg Test, Ur: NEGATIVE

## 2023-08-25 MED ORDER — TAMSULOSIN HCL 0.4 MG PO CAPS: 0.4000 mg | ORAL_CAPSULE | Freq: Every day | ORAL | 0 refills | Status: AC

## 2023-08-25 MED ORDER — ONDANSETRON HCL 4 MG/2ML IJ SOLN
4.0000 mg | Freq: Once | INTRAMUSCULAR | Status: AC
  Administered 2023-08-25: 4 mg via INTRAVENOUS
  Filled 2023-08-25: qty 2

## 2023-08-25 MED ORDER — HYDROCODONE-ACETAMINOPHEN 5-325 MG PO TABS: 1.0000 | ORAL_TABLET | Freq: Four times a day (QID) | ORAL | 0 refills | Status: AC | PRN

## 2023-08-25 MED ORDER — NAPROXEN 500 MG PO TABS: 500.0000 mg | ORAL_TABLET | Freq: Two times a day (BID) | ORAL | 0 refills | Status: AC

## 2023-08-25 MED ORDER — FENTANYL CITRATE PF 50 MCG/ML IJ SOSY
50.0000 ug | PREFILLED_SYRINGE | Freq: Once | INTRAMUSCULAR | Status: AC
  Administered 2023-08-25: 50 ug via INTRAVENOUS
  Filled 2023-08-25: qty 1

## 2023-08-25 MED ORDER — KETOROLAC TROMETHAMINE 30 MG/ML IJ SOLN
30.0000 mg | Freq: Once | INTRAMUSCULAR | Status: AC
  Administered 2023-08-25: 30 mg via INTRAVENOUS
  Filled 2023-08-25: qty 1

## 2023-08-25 NOTE — ED Provider Notes (Signed)
 [place] EMERGENCY DEPARTMENT AT [facility]  Provider Note  CSN: [id number] Arrival date & time: 08/24/23 2259  History Chief Complaint  Patient presents with   Flank Pain   Hematuria    Aimee Howard is a 38 y.o. female here with husband who translates, offered video interpreter but they refused. She has had 2-3 hours of L flank pain and hematuria, similar to previous renal stone. Nausea and vomiting. No fever.    Home Medications Prior to Admission medications   Medication Sig Start Date End Date Taking? Authorizing Provider  HYDROcodone -acetaminophen  (NORCO/VICODIN) 5-325 MG tablet Take 1 tablet by mouth every 6 (six) hours as needed for severe pain (pain score 7-10). 08/25/23  Yes Roselyn Carlin NOVAK, MD  naproxen  (NAPROSYN ) 500 MG tablet Take 1 tablet (500 mg total) by mouth 2 (two) times daily. 08/25/23  Yes Roselyn Carlin NOVAK, MD  tamsulosin  (FLOMAX ) 0.4 MG CAPS capsule Take 1 capsule (0.4 mg total) by mouth daily. 08/25/23  Yes Roselyn Carlin NOVAK, MD  clomiPHENE  (CLOMID ) 50 MG tablet TAKE 2 TABLET ON DAYS 3 THROUGH 7 OF CYCLE 11/01/21   Signa Delon LABOR, NP  Multiple Vitamin (MULTIVITAMIN) tablet Take 1 tablet by mouth daily.    [provider]     Allergies    Patient has no known allergies.   Review of Systems   Review of Systems Please see HPI for pertinent positives and negatives  Physical Exam BP 122/74 (BP Location: Right Arm)   Pulse 94   Temp 98.8 F (37.1 C) (Oral)   Resp 20   Ht 5' 4 (1.626 m)   Wt 68.9 kg   LMP 08/06/2023 (Exact Date)   SpO2 100%   BMI 26.09 kg/m   Physical Exam Vitals and nursing note reviewed.  Constitutional:      Appearance: Normal appearance.  HENT:     Head: Normocephalic and atraumatic.     Nose: Nose normal.     Mouth/Throat:     Mouth: Mucous membranes are moist.  Eyes:     Extraocular Movements: Extraocular movements intact.     Conjunctiva/sclera: Conjunctivae normal.  Cardiovascular:      Rate and Rhythm: Normal rate.  Pulmonary:     Effort: Pulmonary effort is normal.     Breath sounds: Normal breath sounds.  Abdominal:     General: Abdomen is flat.     Palpations: Abdomen is soft.     Tenderness: There is no abdominal tenderness.  Musculoskeletal:        General: No swelling. Normal range of motion.     Cervical back: Neck supple.  Skin:    General: Skin is warm and dry.  Neurological:     General: No focal deficit present.     Mental Status: She is alert.  Psychiatric:        Mood and Affect: Mood normal.     ED Results / Procedures / Treatments   EKG None  Procedures Procedures  Medications Ordered in the ED Medications  fentaNYL  (SUBLIMAZE ) injection 50 mcg (50 mcg Intravenous Given 08/25/23 0120)  ketorolac  (TORADOL ) 30 MG/ML injection 30 mg (30 mg Intravenous Given 08/25/23 0120)  ondansetron  (ZOFRAN ) injection 4 mg (4 mg Intravenous Given 08/25/23 0120)    Initial Impression and Plan  Patient here with flank pain consistent with renal colic. Labs done in triage show UA with blood, HCG is neg. Awaiting blood work and CT. Pain/nausea meds for comfort.   ED Course  Clinical Course as of 08/25/23 [id number]  [place] Aug 25, 2023  0048 I personally viewed the images from radiology studies and agree with radiologist interpretation: CT shows a distal left UVJ stone.  [CS]  0052 CBC is unremarkable.   [CS]  0109 BMP is unremarkable.  [CS]  0135 Patient's pain is resolved. Discussed CT findings, plan discharge with stainer, Rx for naprosyn , norco and flomax . Urology follow up, RTED for any other concerns.  [CS]    Clinical Course User Index [CS] Roselyn Carlin NOVAK, MD     MDM Rules/Calculators/A&P Medical Decision Making Given presenting complaint, I considered that admission might be necessary. After review of results from ED lab and/or imaging studies, admission to the hospital is not indicated at this time.    Problems Addressed: Kidney stone: acute  illness or injury  Amount and/or Complexity of Data Reviewed Labs: ordered. Decision-making details documented in ED Course. Radiology: ordered and independent interpretation performed. Decision-making details documented in ED Course.  Risk Prescription drug management. Parenteral controlled substances. Decision regarding hospitalization.     Final Clinical Impression(s) / ED Diagnoses Final diagnoses:  Kidney stone    Rx / DC Orders ED Discharge Orders          Ordered    naproxen  (NAPROSYN ) 500 MG tablet  2 times daily        08/25/23 0137    HYDROcodone -acetaminophen  (NORCO/VICODIN) 5-325 MG tablet  Every 6 hours PRN        08/25/23 0137    tamsulosin  (FLOMAX ) 0.4 MG CAPS capsule  Daily        08/25/23 0137             Roselyn Carlin NOVAK, MD 08/25/23 [phone]

## 2023-08-26 ENCOUNTER — Ambulatory Visit: Payer: Self-pay

## 2023-09-02 ENCOUNTER — Telehealth: Payer: Self-pay | Admitting: *Deleted

## 2023-09-02 NOTE — Progress Notes (Signed)
Transition Care Management Unsuccessful Follow-up Telephone Call  Date of discharge and from where:  [facility]  08/25/2023  Attempts:  1st Attempt  Reason for unsuccessful TCM follow-up call:  No answer/busy

## 2023-09-02 NOTE — Progress Notes (Signed)
Complex Care Management Note Care Guide Note  09/02/2023 Name: Aimee Howard MRN: [id number] DOB: 1986/07/29   Complex Care Management Outreach Attempts: A second unsuccessful outreach was attempted today to offer the patient with information about available complex care management services.  Follow Up Plan:  Additional outreach attempts will be made to offer the patient complex care management information and services.   Encounter Outcome:  No Answer .

## 2024-11-02 ENCOUNTER — Encounter: Admitting: Obstetrics & Gynecology
